# Patient Record
Sex: Female | Born: 1955 | Hispanic: No | Marital: Married | Smoking: Never smoker
Health system: Southern US, Community
[De-identification: ages and names within clinical notes are randomized; demographics above are authoritative.]

---

## 1999-03-16 ENCOUNTER — Other Ambulatory Visit: Admission: RE | Admit: 1999-03-16 | Discharge: 1999-03-16 | Payer: Self-pay | Admitting: *Deleted

## 2000-04-05 ENCOUNTER — Other Ambulatory Visit: Admission: RE | Admit: 2000-04-05 | Discharge: 2000-04-05 | Payer: Self-pay | Admitting: *Deleted

## 2001-04-11 ENCOUNTER — Other Ambulatory Visit: Admission: RE | Admit: 2001-04-11 | Discharge: 2001-04-11 | Payer: Self-pay | Admitting: *Deleted

## 2002-04-21 ENCOUNTER — Other Ambulatory Visit: Admission: RE | Admit: 2002-04-21 | Discharge: 2002-04-21 | Payer: Self-pay | Admitting: *Deleted

## 2003-04-27 ENCOUNTER — Other Ambulatory Visit: Admission: RE | Admit: 2003-04-27 | Discharge: 2003-04-27 | Payer: Self-pay | Admitting: *Deleted

## 2006-07-05 ENCOUNTER — Ambulatory Visit: Payer: Self-pay | Admitting: Gastroenterology

## 2010-07-19 ENCOUNTER — Encounter: Admission: RE | Admit: 2010-07-19 | Discharge: 2010-07-19 | Payer: Self-pay | Admitting: Family Medicine

## 2011-08-10 ENCOUNTER — Encounter: Payer: Self-pay | Admitting: Genetic Counselor

## 2011-09-11 ENCOUNTER — Telehealth: Payer: Self-pay | Admitting: Genetic Counselor

## 2013-08-12 ENCOUNTER — Encounter: Payer: Self-pay | Admitting: *Deleted

## 2013-08-12 NOTE — Progress Notes (Signed)
Per Clydie Braun - put an 11 lab appt on pts schedule.

## 2013-11-13 ENCOUNTER — Other Ambulatory Visit: Payer: BC Managed Care – PPO

## 2013-11-13 ENCOUNTER — Ambulatory Visit (HOSPITAL_BASED_OUTPATIENT_CLINIC_OR_DEPARTMENT_OTHER): Payer: BC Managed Care – PPO | Admitting: Genetic Counselor

## 2013-11-13 ENCOUNTER — Encounter: Payer: Self-pay | Admitting: Genetic Counselor

## 2013-11-13 ENCOUNTER — Encounter (INDEPENDENT_AMBULATORY_CARE_PROVIDER_SITE_OTHER): Payer: Self-pay

## 2013-11-13 DIAGNOSIS — Z8041 Family history of malignant neoplasm of ovary: Secondary | ICD-10-CM

## 2013-11-13 DIAGNOSIS — IMO0002 Reserved for concepts with insufficient information to code with codable children: Secondary | ICD-10-CM

## 2013-11-13 DIAGNOSIS — Z8042 Family history of malignant neoplasm of prostate: Secondary | ICD-10-CM

## 2013-11-13 NOTE — Progress Notes (Signed)
Dr.  Larina Earthly requested a consultation for genetic counseling and risk assessment for Bailey Holmes, a 58 y.o. female, for discussion of her family history of ovarian, prostate, melanoma, and possibly colon cancer.  She presents to clinic today to discuss the possibility of a genetic predisposition to cancer, and to further clarify her risks, as well as her family members' risks for cancer.   HISTORY OF PRESENT ILLNESS: Bailey Holmes is a 58 y.o. female with no personal history of cancer.  She had a colonoscopy in 2006 that was reportedly negative for polyps.  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  In 2012 she was tested through [facility] for BRCA mutations, but did not have BART.  That testing was negative.  History reviewed. No pertinent past medical history.  History reviewed. No pertinent past surgical history.  History   Social History  . Marital Status: Unknown    Spouse Name: N/A    Number of Children: 0  . Years of Education: N/A   Social History Main Topics  . Smoking status: Never Smoker   . Smokeless tobacco: None  . Alcohol Use: No  . Drug Use: No  . Sexual Activity: None   Other Topics Concern  . None   Social History Narrative  . None    REPRODUCTIVE HISTORY AND PERSONAL RISK ASSESSMENT FACTORS: Menarche was at age 38.   postmenopausal Uterus Intact: yes Ovaries Intact: no, removed at age 34 due to mother's diagnosis. [id number], first live birth at age N/A  She has not previously undergone treatment for infertility.   Oral Contraceptive use: 18 years   She has used HRT in the past.    FAMILY HISTORY:  We obtained a detailed, 4-generation family history.  Significant diagnoses are listed below: Family History  Problem Relation Age of Onset  . Ovarian cancer Mother 59  . Prostate cancer Father     dx in his early 20s, came back at 25  . Melanoma Father 10  . Lymphoma Father 67  . Hodgkin's lymphoma  Father     dx in his 55s  . Cancer Maternal Aunt     unsure if colon or ovarian cancer  . Cancer Paternal Uncle     cancer NOS  . COPD Maternal Grandmother   . Liver cancer Maternal Grandfather     unsure if this was the primary or a met.  . Prostate cancer Paternal Grandfather   . Lung cancer Maternal Aunt     smoker    Patient's ancestors are of [place] descent. There is no reported Ashkenazi Jewish ancestry. There is no known consanguinity.  GENETIC COUNSELING ASSESSMENT: Bailey Holmes is a 58 y.o. female with a family history of ovarian, prostate, melanoma, hodgkins lymphoma and possibly colon cancer which somewhat suggestive of a hereditary cancer syndrome and predisposition to cancer. We, therefore, discussed and recommended the following at today's visit.   DISCUSSION: We reviewed the characteristics, features and inheritance patterns of hereditary cancer syndromes. We also discussed genetic testing, including the appropriate family members to test, the process of testing, insurance coverage and turn-around-time for results. We discussed that her negative BRCA test reduced the likelihood of her having a BRCA mutation, although many families are missed by not having the del/dup testing.  Within the last year, [name] learned that her maternal aunt had either colon or ovarian cancer.  We discussed Lynch syndrome testing based on a possible  colon cancer diagnosis in her maternal aunt, and the ovarian cancer in her mother.  [name] was not tested for Lynch syndrome in the past. Lastly, we reviewed that there are several other hereditary cancer genes that are associated with ovarian cancer.  We will recommend the breast/ovarian cancer panel for Ms. Campo.  PLAN: After considering the risks, benefits, and limitations, [name] provided informed consent to pursue genetic testing and the blood sample will be sent to [facility] for analysis of the Breast/Ovarian cancer panel. We  discussed the implications of a positive, negative and/ or variant of uncertain significance genetic test result. Results should be available within approximately 3 weeks' time, at which point they will be disclosed by telephone to Bailey Holmes, as will any additional recommendations warranted by these results. Bailey Holmes will receive a summary of her genetic counseling visit and a copy of her results once available. This information will also be available in Epic. We encouraged Bailey Holmes to remain in contact with cancer genetics annually so that we can continuously update the family history and inform her of any changes in cancer genetics and testing that may be of benefit for her family. Bailey Holmes's questions were answered to her satisfaction today. Our contact information was provided should additional questions or concerns arise.  The patient was seen for a total of 45 minutes, greater than 50% of which was spent face-to-face counseling.  This note will also be sent to the referring provider via the electronic medical record. The patient will be supplied with a summary of this genetic counseling discussion as well as educational information on the discussed hereditary cancer syndromes following the conclusion of their visit.   Patient was discussed with Dr. Marcy Panning.   _______________________________________________________________________ For Office Staff:  Number of people involved in session: 2 Was an Intern/ student involved with case: no

## 2013-11-25 ENCOUNTER — Encounter: Payer: Self-pay | Admitting: Genetic Counselor

## 2013-11-25 ENCOUNTER — Telehealth: Payer: Self-pay | Admitting: Genetic Counselor

## 2013-11-25 NOTE — Telephone Encounter (Signed)
Left good news message on machine. 

## 2013-11-25 NOTE — Telephone Encounter (Signed)
REvealed negative genetic testing.  Discussed taht we do not know why her mother had ovarian cancer at such a young age, but that it does not appear that she had a genetic change in any of the genes that we looked at, that was then passed to Bailey Holmes.  Discussed to keep in touch with us every 3-5 years to see if there is updated testing taht needs to be performed.

## 2014-02-06 NOTE — Telephone Encounter (Signed)
Please see Visit Info comments 

## 2014-10-28 ENCOUNTER — Other Ambulatory Visit: Payer: Self-pay | Admitting: Family Medicine

## 2014-10-28 DIAGNOSIS — R1084 Generalized abdominal pain: Secondary | ICD-10-CM

## 2014-11-04 ENCOUNTER — Other Ambulatory Visit: Payer: Self-pay

## 2014-11-05 ENCOUNTER — Ambulatory Visit
Admission: RE | Admit: 2014-11-05 | Discharge: 2014-11-05 | Disposition: A | Discharge status: Home / Self Care | Payer: BC Managed Care – PPO | Source: Ambulatory Visit | Attending: Family Medicine | Admitting: Family Medicine

## 2014-11-05 DIAGNOSIS — R1084 Generalized abdominal pain: Secondary | ICD-10-CM

## 2015-02-22 ENCOUNTER — Telehealth: Payer: Self-pay | Admitting: Genetic Counselor

## 2015-02-22 NOTE — Telephone Encounter (Signed)
LM on VM stating the information that I had received from billing at [facility].  Called [facility], they confirmed that her OOP is $100. They are still appealing with the insurance company, and they can appeal up to 3 times. What she is receiving, is also being seen by the billing department. Regardless of whether [employer] pays all, a portion, or none of the bill, her OOP will not change. Most likely, [employer] will send her a check for the portion they will pay, and she will then need to endorse the check and send to [facility].

## 2015-02-22 NOTE — Telephone Encounter (Signed)
Bailey Holmes called and left an irate VM that she has received a letter from her insurance company stating that they will not cover the test.  Called [facility], they confirmed that her OOP is $100.  They are still appealing with the insurance company, and they can appeal up to 3 times.  What she is receiving, is also being seen by the billing department.  Regardless of whether [employer] pays all, a portion, or none of the bill, her OOP will not change.  Most likely, [employer] will send her a check for the portion they will pay, and she will then need to endorse the check and send to [facility].

## 2016-01-16 IMAGING — US US ABDOMEN COMPLETE
1 series · 14 of 25 positions shown · non-contrast
Comparison: None.

CLINICAL DATA: Abdominal pain.

EXAM:
ULTRASOUND ABDOMEN COMPLETE

[Series 1: us abdomen complete · 0.24mm/px · 14 of 77 slices shown]
[im 1/77]
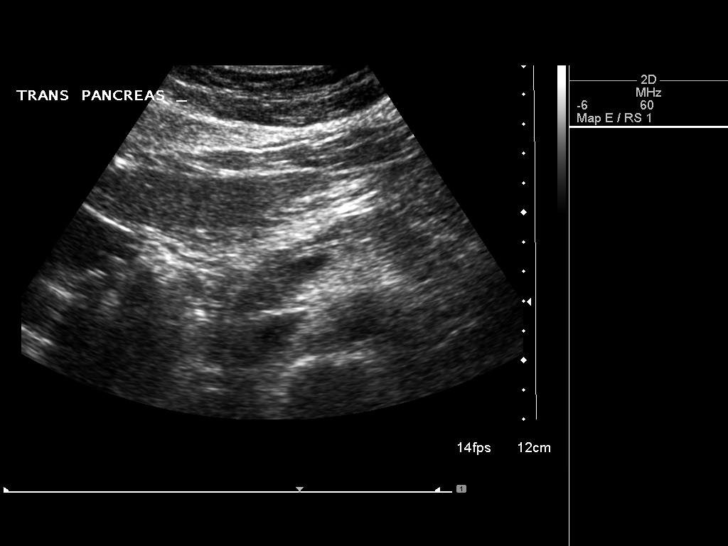
[im 7/77]
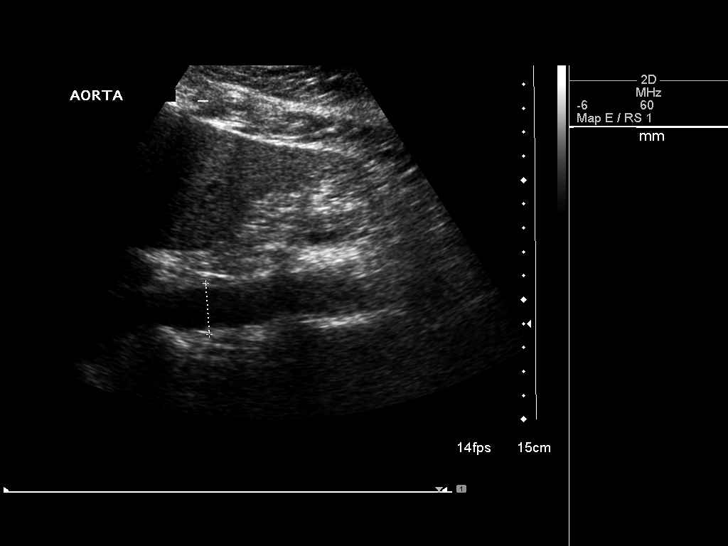
[im 13/77]
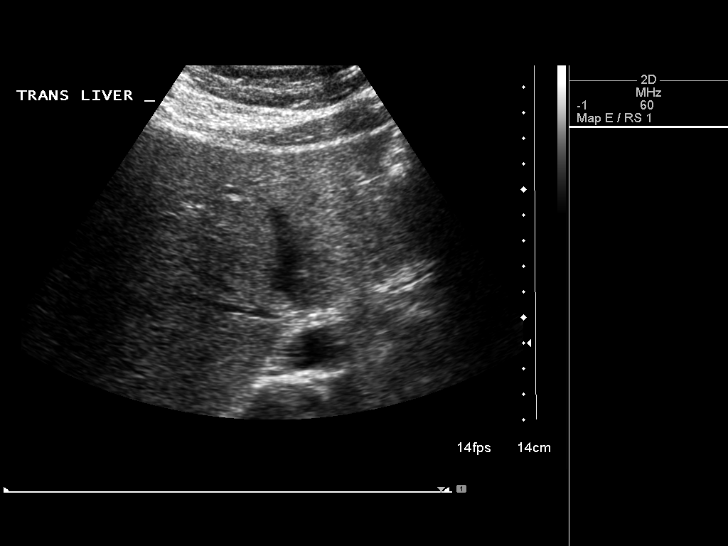
[im 20/77]
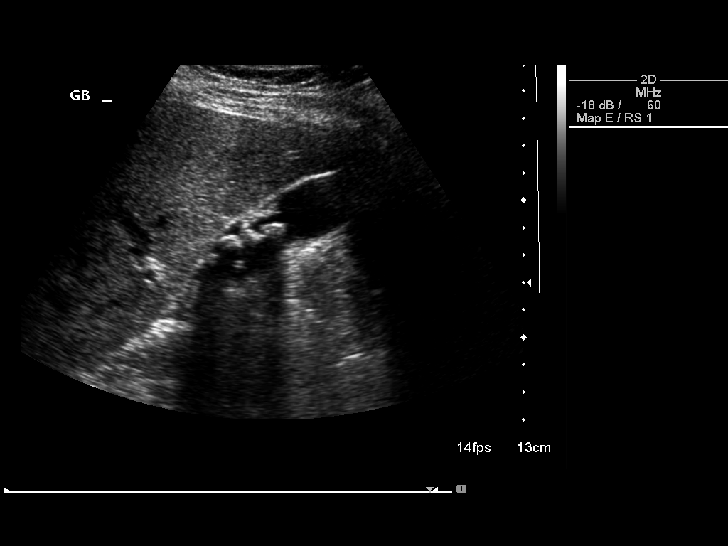
[im 26/77]
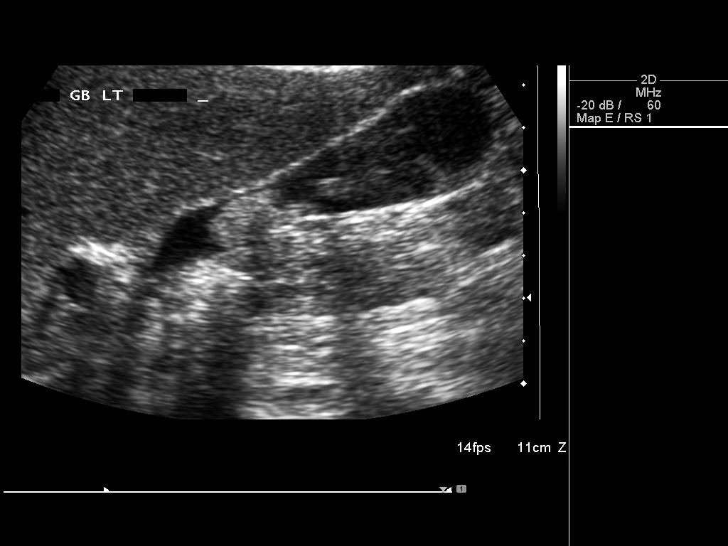
[im 29/77]
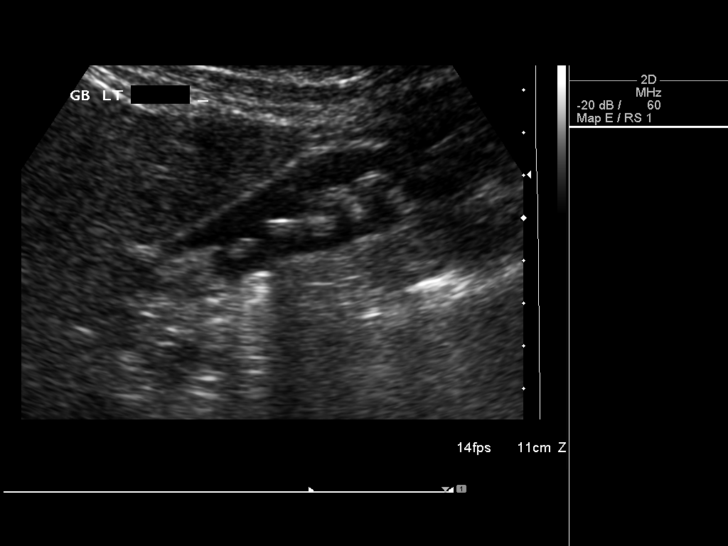
[im 35/77]
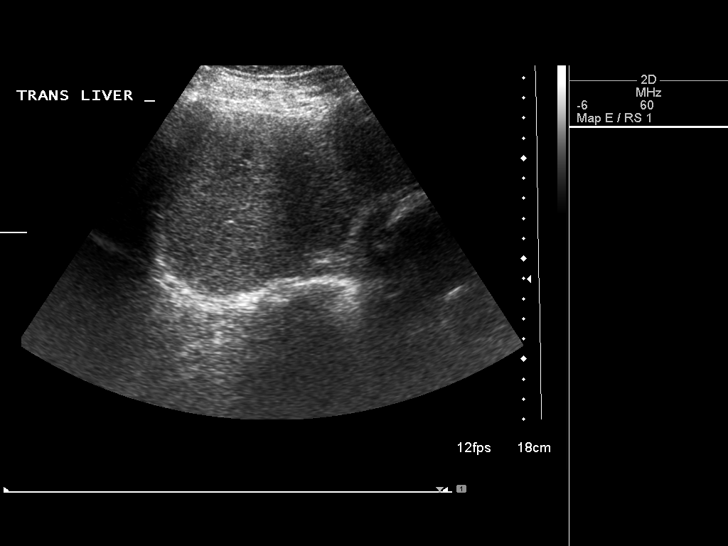
[im 42/77]
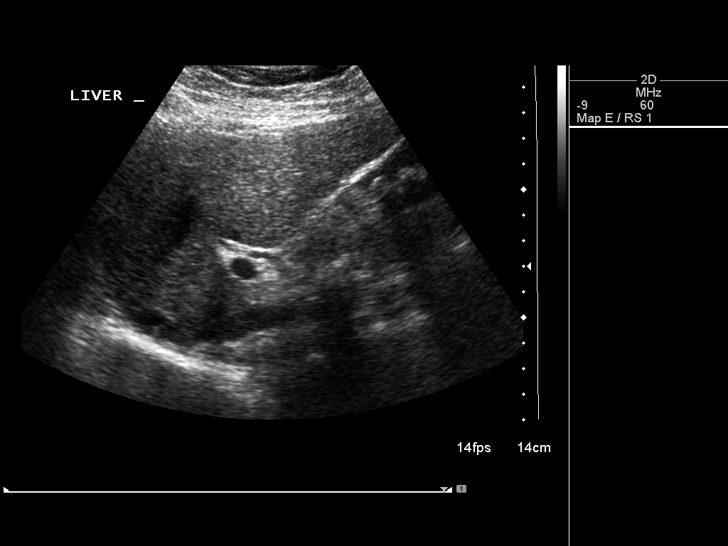
[im 48/77]
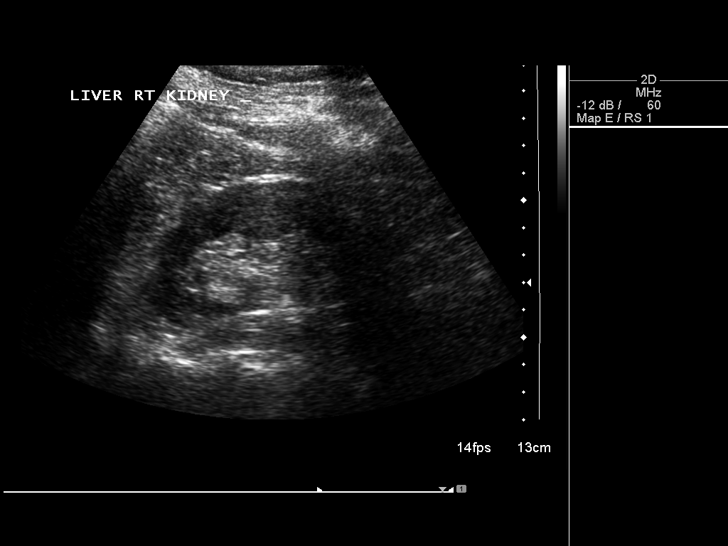
[im 51/77]
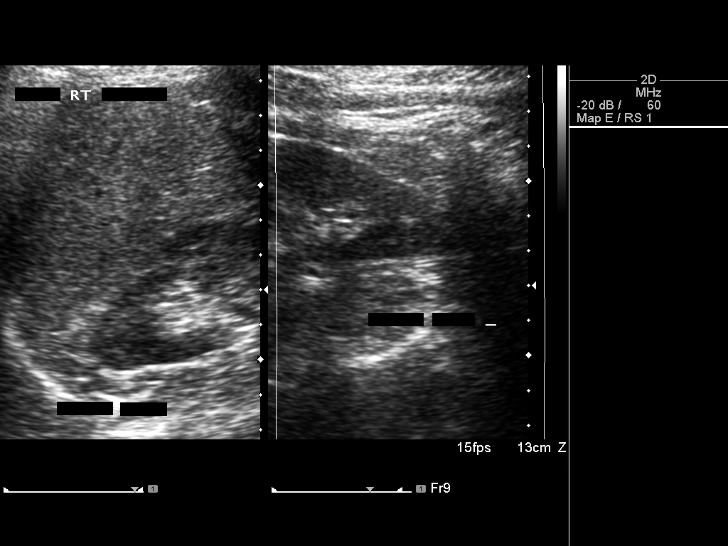
[im 58/77]
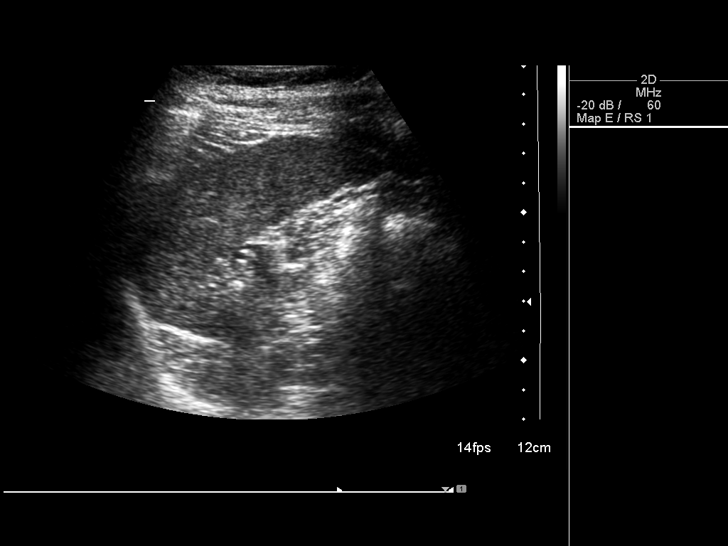
[im 64/77]
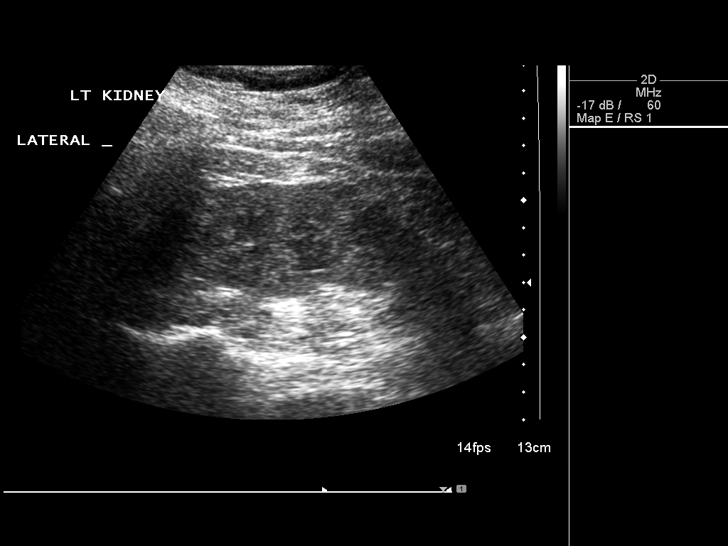
[im 70/77]
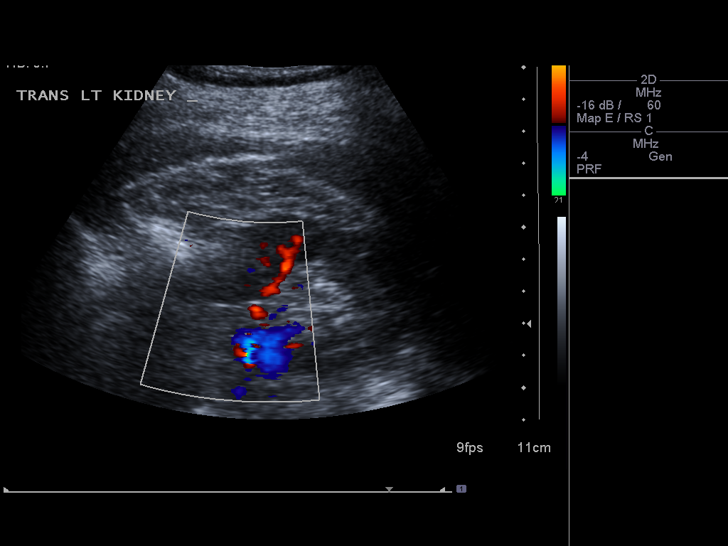
[im 77/77]
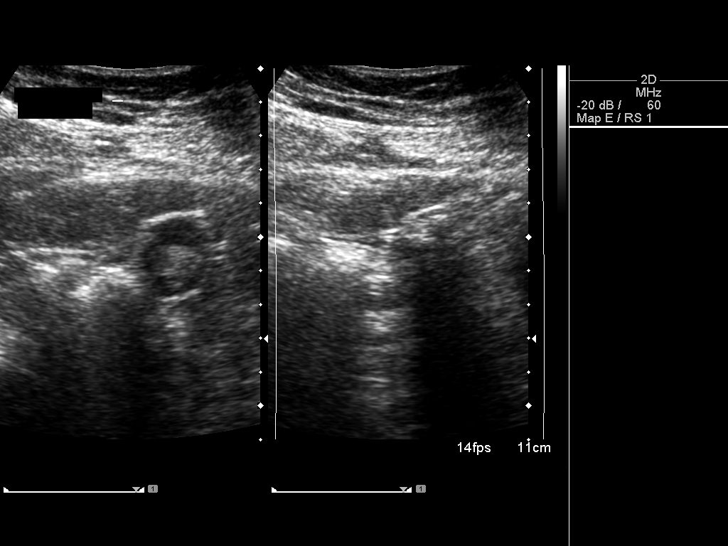

[14 of 25 positions shown; findings below may reference images not displayed]

FINDINGS: Gallbladder: Stones and sludge noted. Gallbladder wall thickness
mm. Negative Murphy sign.

Common bile duct: Diameter: 3.1 mm

Liver: No focal lesion identified. Within normal limits in
parenchymal echogenicity.

IVC: No abnormality visualized.

Pancreas: Visualized portion unremarkable.

Spleen: Size and appearance within normal limits.

Right Kidney: Length: 11.7 cm. Echogenicity within normal limits. No
mass or hydronephrosis visualized.

Left Kidney: Length: 12.4 cm. Echogenicity within normal limits. No
mass or hydronephrosis visualized.

Abdominal aorta: No aneurysm visualized.

Other findings: None.
IMPRESSION: Gallstones and sludge. Gallbladder wall thickness normal. Negative
Murphy's sign. No biliary distention.

## 2017-05-14 ENCOUNTER — Other Ambulatory Visit: Payer: Self-pay | Admitting: Family Medicine

## 2017-05-14 ENCOUNTER — Ambulatory Visit
Admission: RE | Admit: 2017-05-14 | Discharge: 2017-05-14 | Disposition: A | Discharge status: Home / Self Care | Payer: BC Managed Care – PPO | Source: Ambulatory Visit | Attending: Family Medicine | Admitting: Family Medicine

## 2017-05-14 DIAGNOSIS — M25551 Pain in right hip: Secondary | ICD-10-CM | POA: Diagnosis present

## 2017-05-15 ENCOUNTER — Ambulatory Visit: Payer: BC Managed Care – PPO

## 2018-10-10 ENCOUNTER — Ambulatory Visit
Admission: RE | Admit: 2018-10-10 | Discharge: 2018-10-10 | Disposition: A | Discharge status: Home / Self Care | Payer: BC Managed Care – PPO | Source: Ambulatory Visit | Attending: Family Medicine | Admitting: Family Medicine

## 2018-10-10 ENCOUNTER — Other Ambulatory Visit: Payer: Self-pay | Admitting: Family Medicine

## 2018-10-10 DIAGNOSIS — M2540 Effusion, unspecified joint: Secondary | ICD-10-CM

## 2018-11-06 ENCOUNTER — Encounter: Payer: Self-pay | Admitting: Genetic Counselor

## 2019-12-21 IMAGING — CR DG HAND 2V*L*
2 series · 2 of 2 positions shown · non-contrast
Comparison: 04/04/2018

CLINICAL DATA: Hand swelling, joint pain

EXAM:
LEFT HAND - 2 VIEW

[x hand pa left]
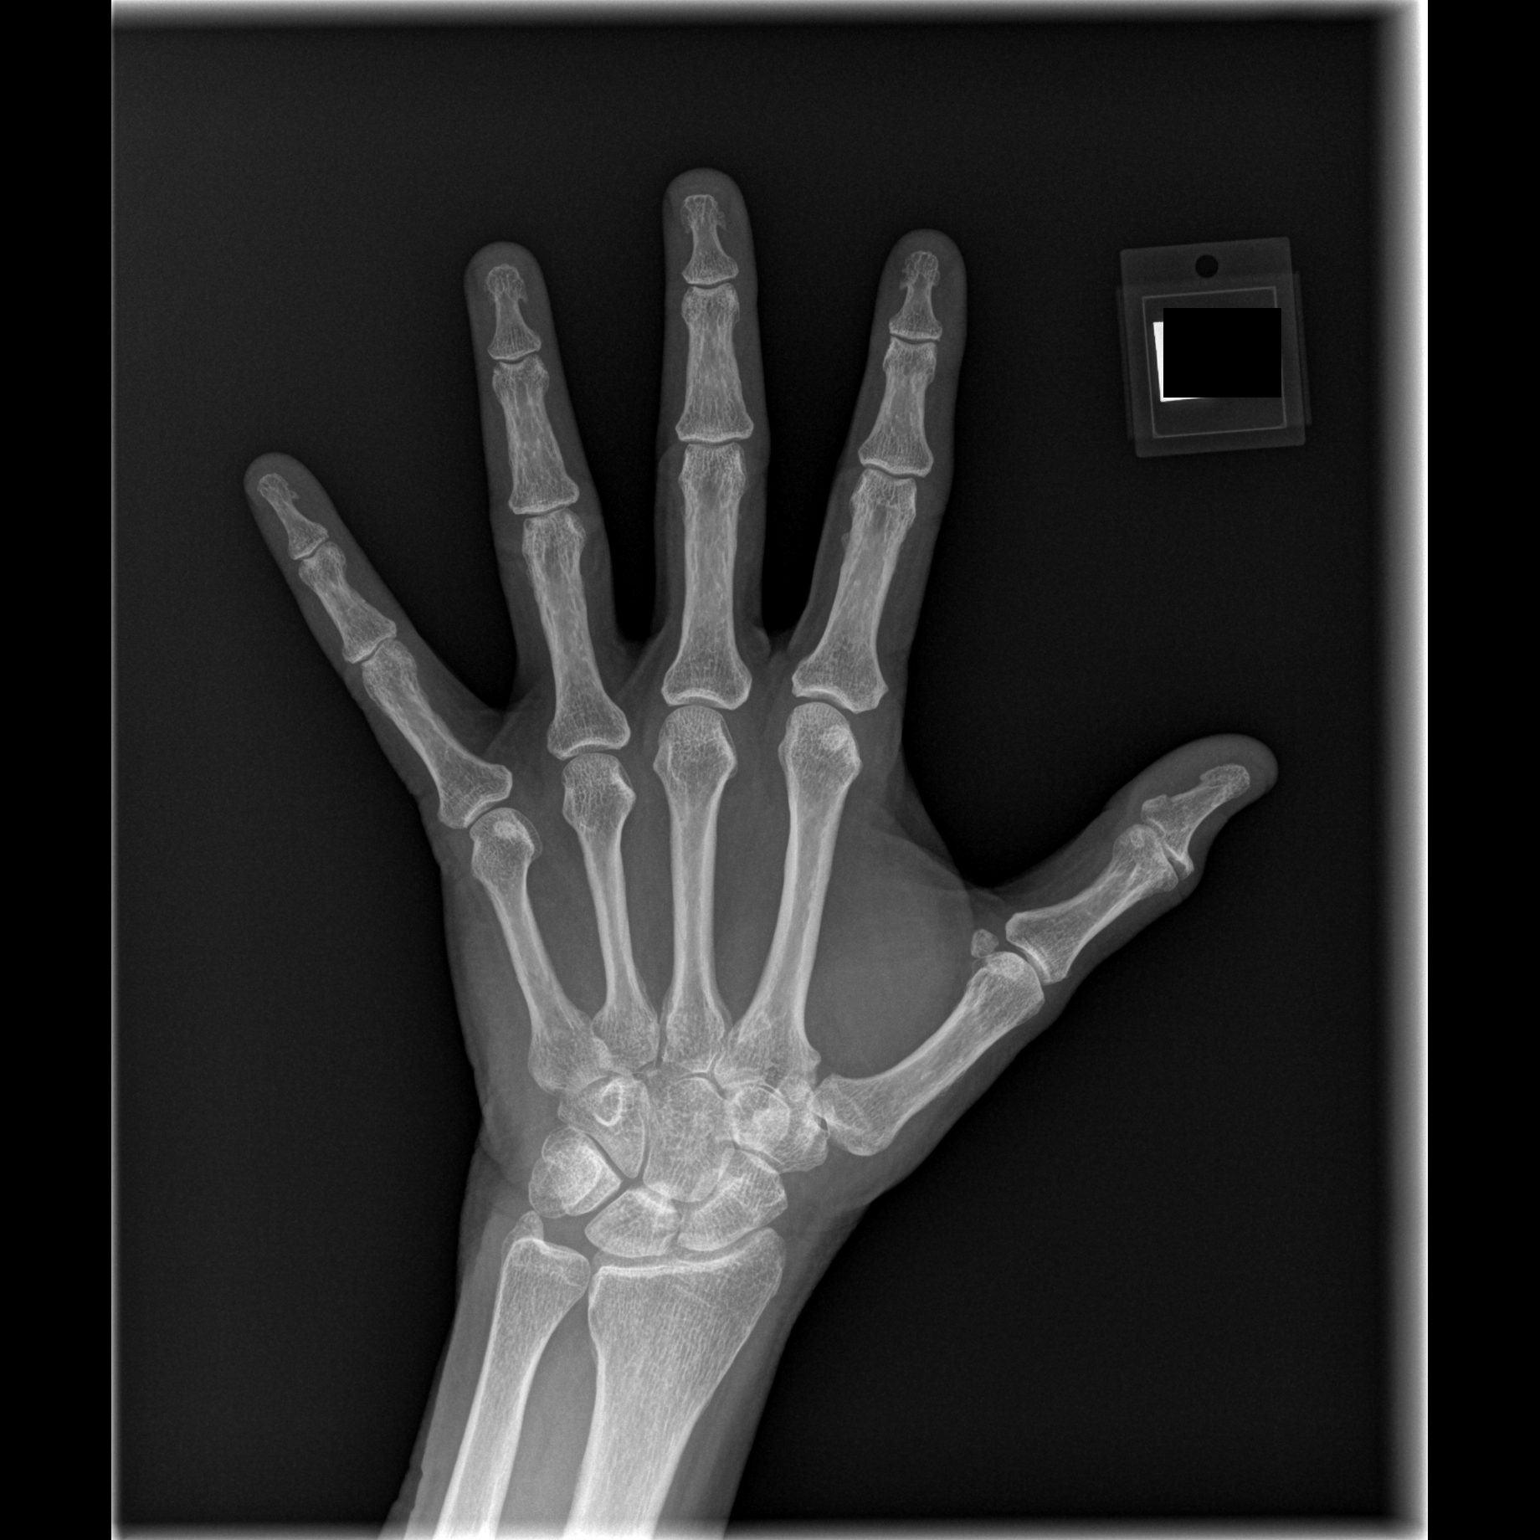

[x hand lat left]
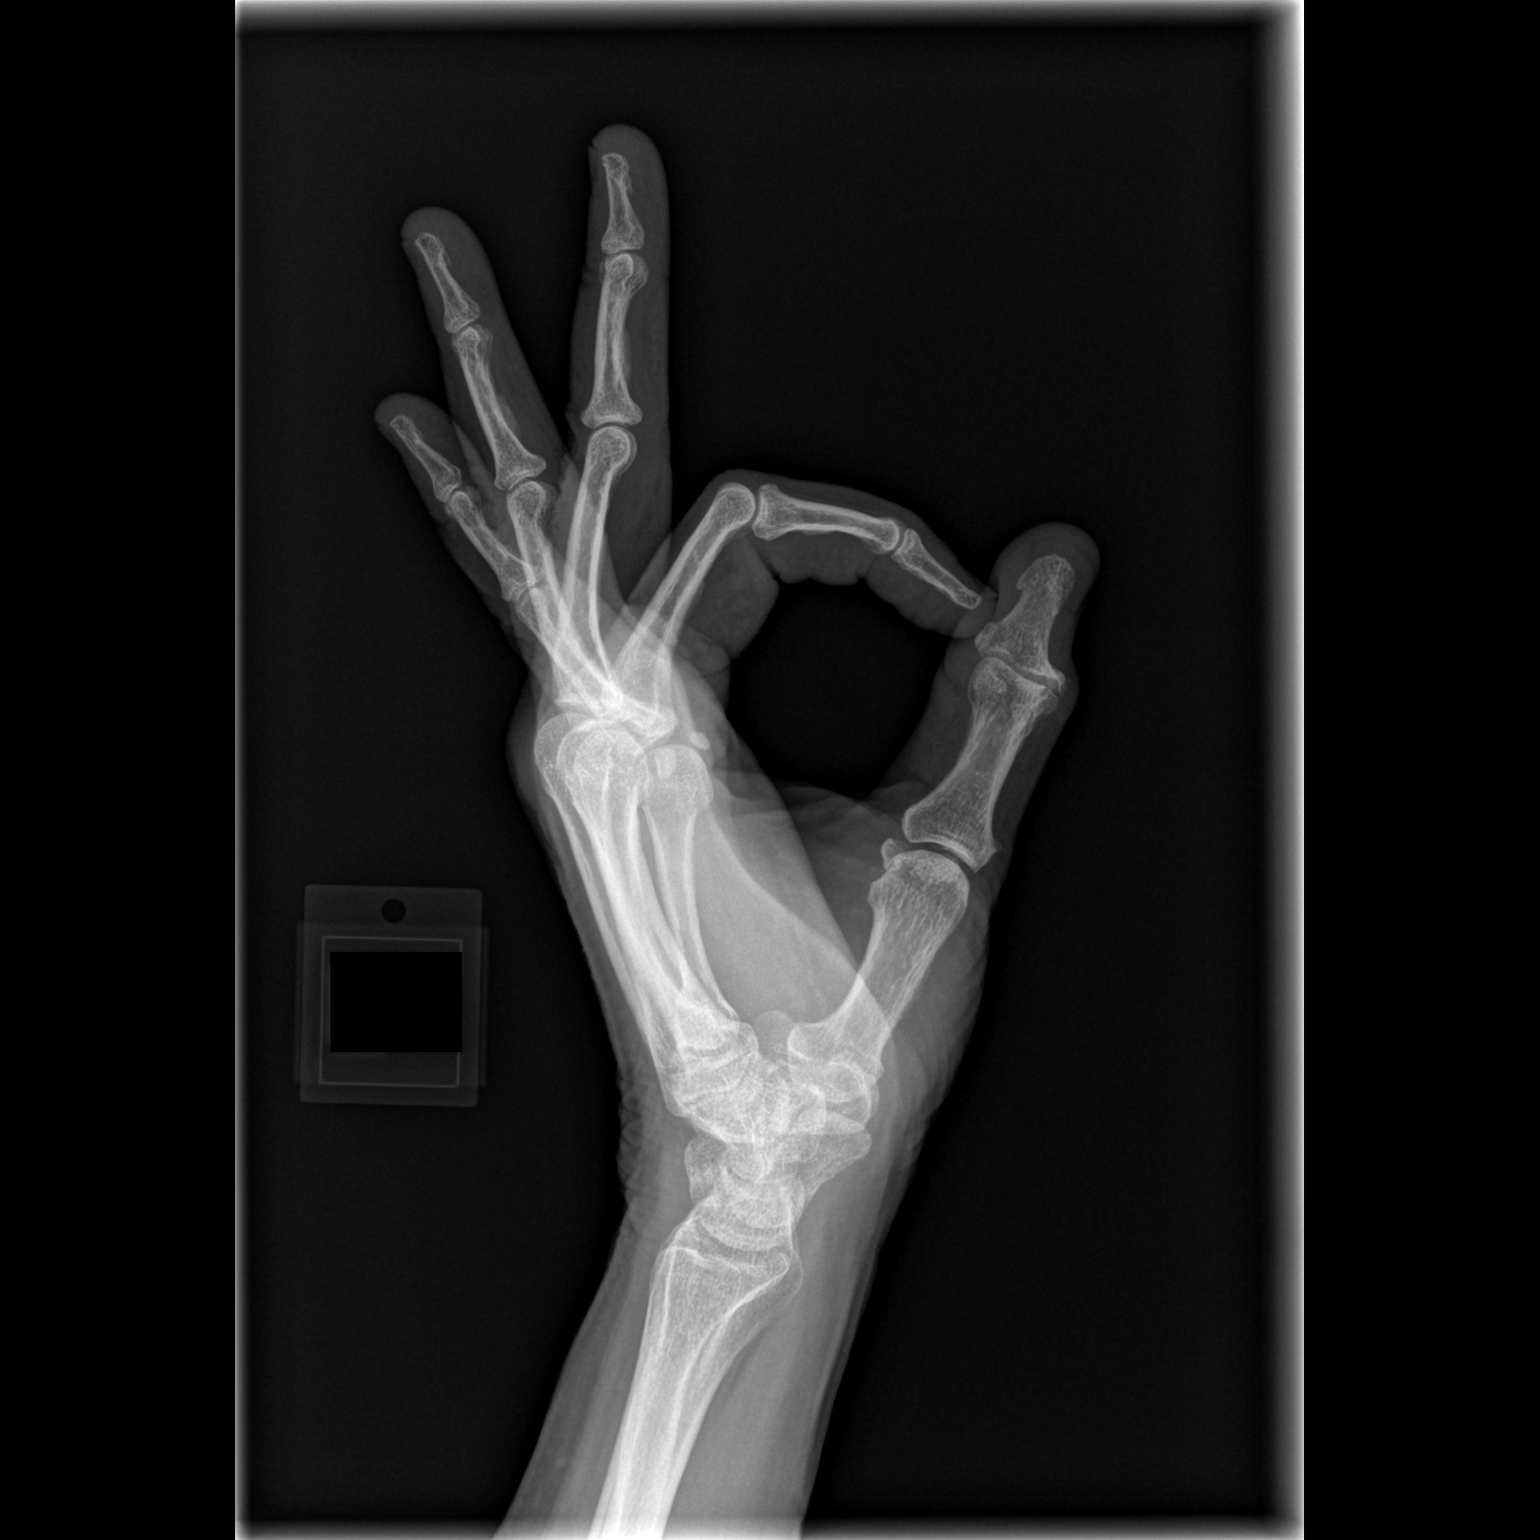

[2 of 2 positions shown; findings below may reference images not displayed]

FINDINGS: Normal alignment without acute osseous finding. Negative for
fracture. No soft tissue abnormality. Minor joint space loss of the
DIP and PIP joints diffusely as well as the thumb interphalangeal
joint compatible with osteoarthritis. No significant erosive
process. No soft tissue abnormality or abnormal calcification.
IMPRESSION: Minor joint space loss of the DIP and PIP joints as well as the
thumb interphalangeal joint compatible with osteoarthritis.

No acute finding.

## 2019-12-21 IMAGING — CR DG HAND 2V*R*
2 series · 2 of 2 positions shown · non-contrast
Comparison: None

CLINICAL DATA: BILATERAL hand pain and swelling of joints for 6
months

EXAM:
RIGHT HAND - 2 VIEW

[x hand pa right]
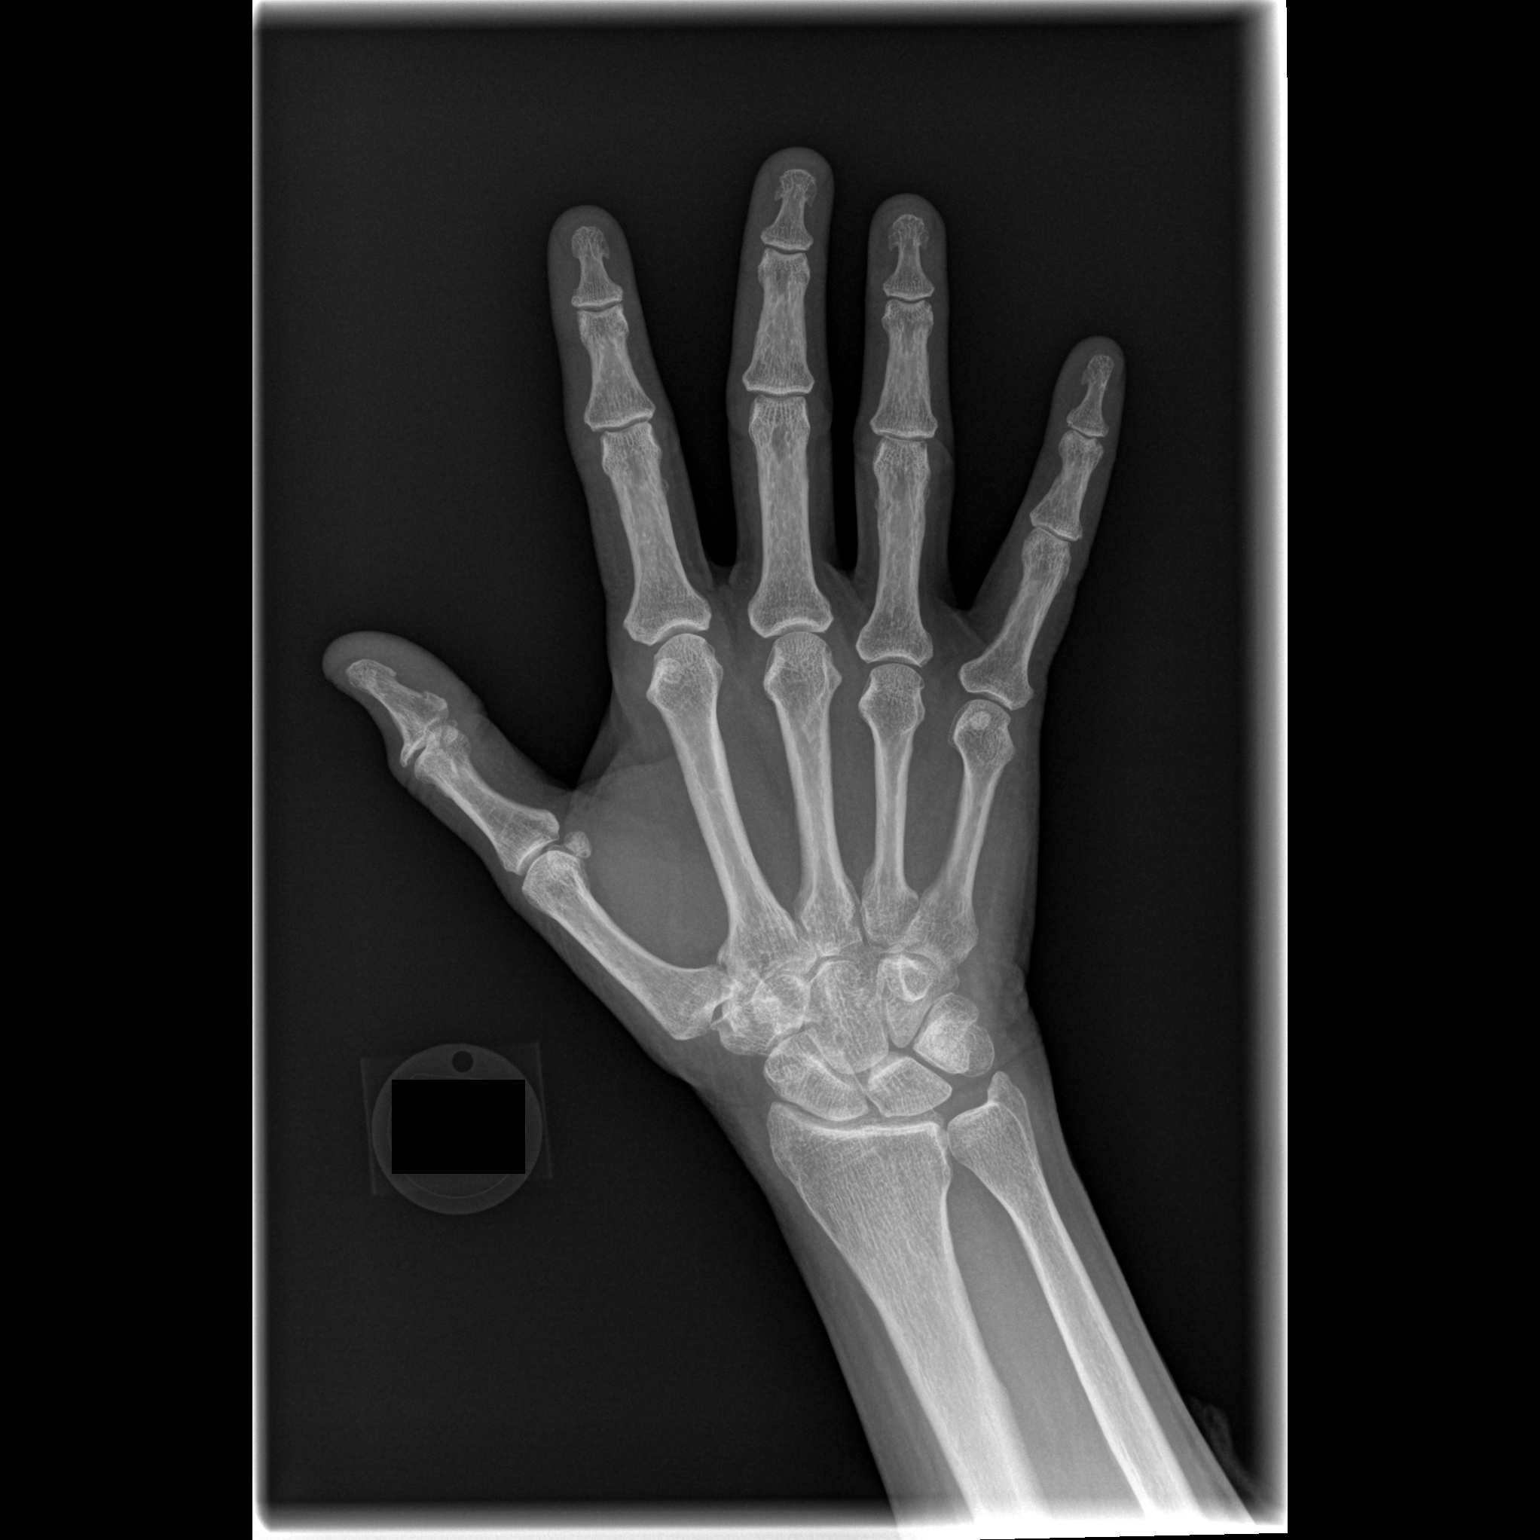

[x hand lat right]
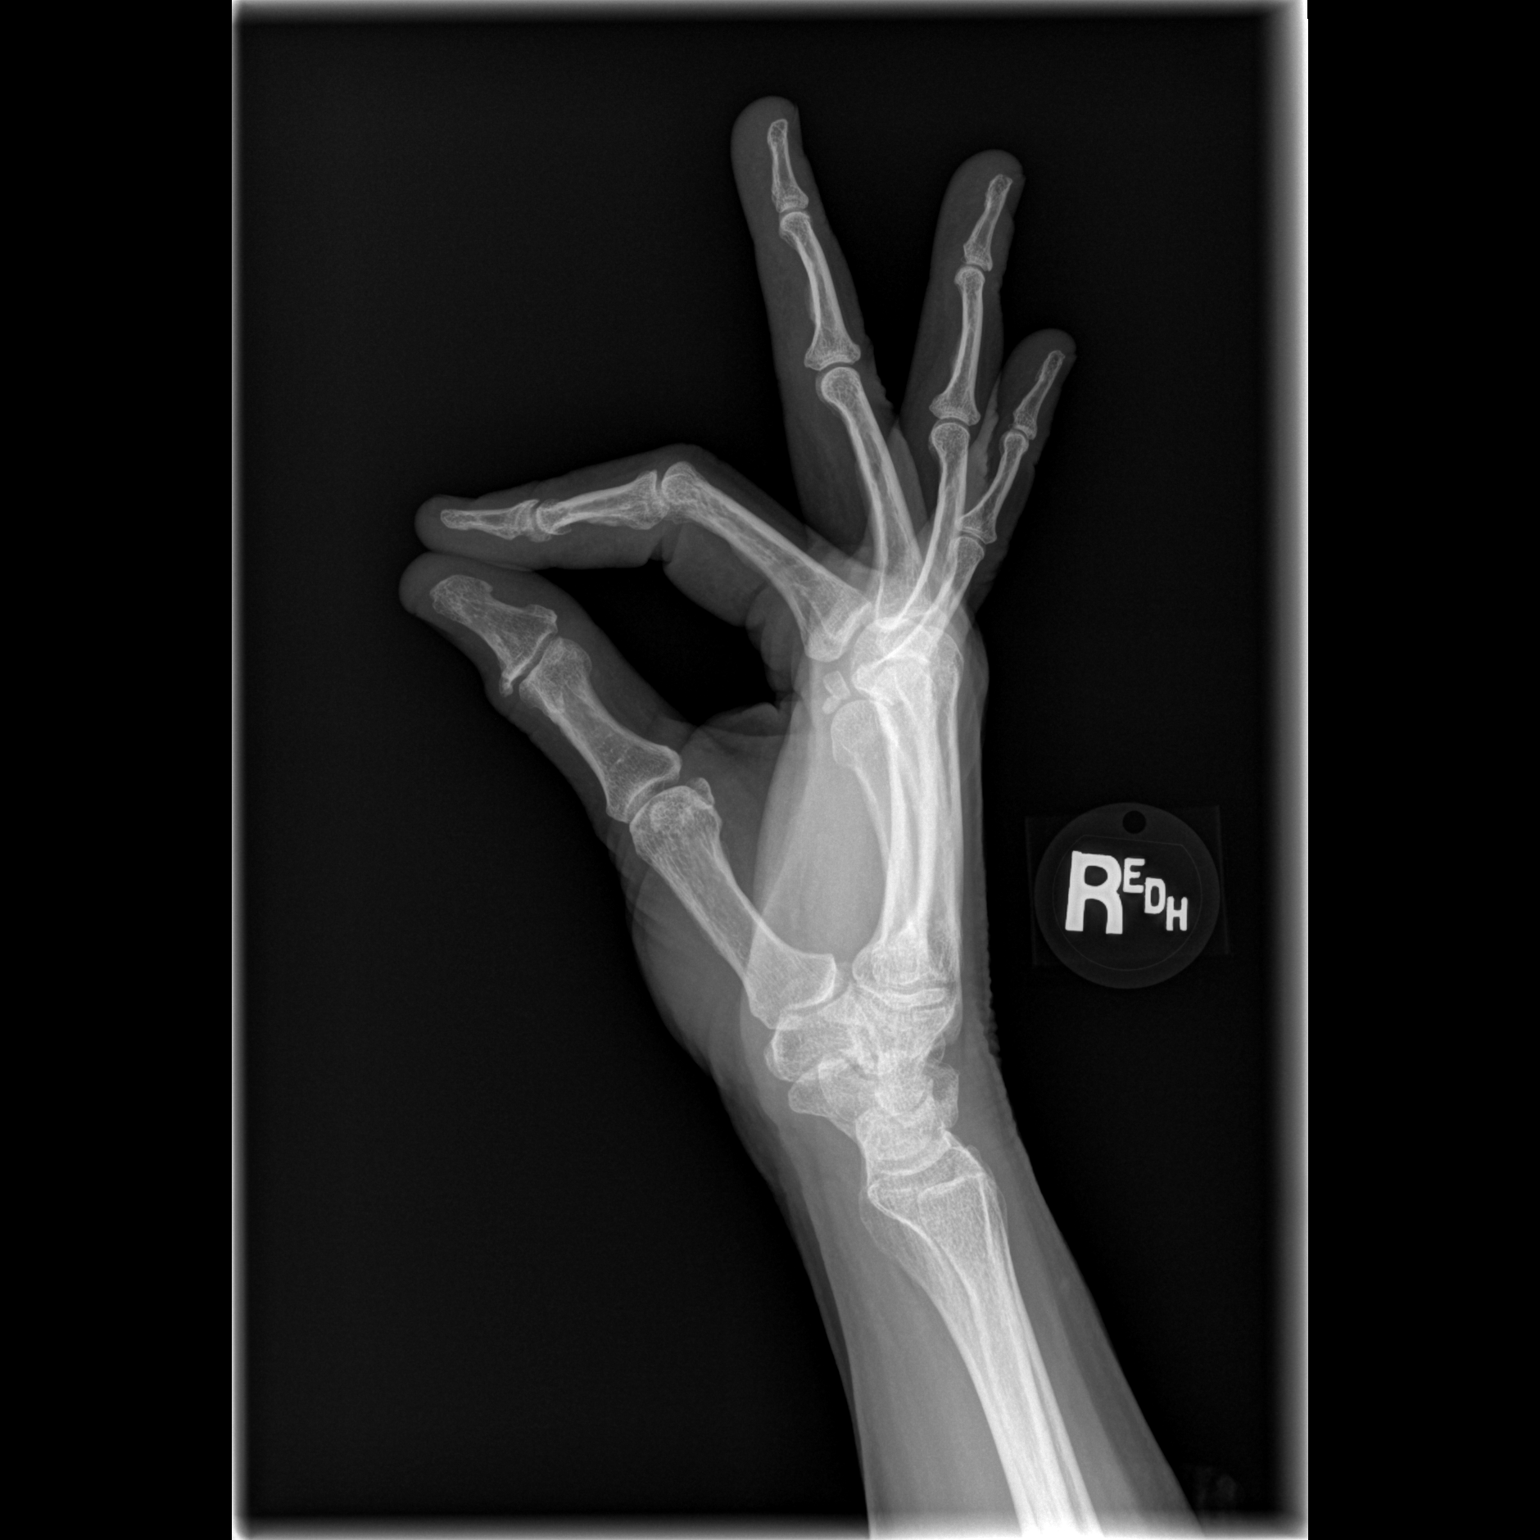

[2 of 2 positions shown; findings below may reference images not displayed]

FINDINGS: Osseous mineralization diffusely.

Joint spaces preserved.

No acute fracture, dislocation, or bone destruction.

No erosive or inflammatory changes.
IMPRESSION: No acute abnormalities.

## 2023-01-18 DIAGNOSIS — E538 Deficiency of other specified B group vitamins: Secondary | ICD-10-CM | POA: Diagnosis not present

## 2023-01-18 DIAGNOSIS — E559 Vitamin D deficiency, unspecified: Secondary | ICD-10-CM | POA: Diagnosis not present

## 2023-01-18 DIAGNOSIS — Z0000 Encounter for general adult medical examination without abnormal findings: Secondary | ICD-10-CM | POA: Diagnosis not present

## 2023-01-18 DIAGNOSIS — E2839 Other primary ovarian failure: Secondary | ICD-10-CM | POA: Diagnosis not present

## 2023-01-18 DIAGNOSIS — F4321 Adjustment disorder with depressed mood: Secondary | ICD-10-CM | POA: Diagnosis not present

## 2023-04-16 DIAGNOSIS — D225 Melanocytic nevi of trunk: Secondary | ICD-10-CM | POA: Diagnosis not present

## 2023-04-16 DIAGNOSIS — L570 Actinic keratosis: Secondary | ICD-10-CM | POA: Diagnosis not present

## 2023-04-16 DIAGNOSIS — D2262 Melanocytic nevi of left upper limb, including shoulder: Secondary | ICD-10-CM | POA: Diagnosis not present

## 2023-04-16 DIAGNOSIS — L819 Disorder of pigmentation, unspecified: Secondary | ICD-10-CM | POA: Diagnosis not present

## 2023-04-16 DIAGNOSIS — L820 Inflamed seborrheic keratosis: Secondary | ICD-10-CM | POA: Diagnosis not present

## 2023-04-16 DIAGNOSIS — D485 Neoplasm of uncertain behavior of skin: Secondary | ICD-10-CM | POA: Diagnosis not present

## 2023-04-16 DIAGNOSIS — D2239 Melanocytic nevi of other parts of face: Secondary | ICD-10-CM | POA: Diagnosis not present

## 2023-04-16 DIAGNOSIS — L821 Other seborrheic keratosis: Secondary | ICD-10-CM | POA: Diagnosis not present

## 2023-04-16 DIAGNOSIS — Z85828 Personal history of other malignant neoplasm of skin: Secondary | ICD-10-CM | POA: Diagnosis not present

## 2023-05-14 DIAGNOSIS — Z03818 Encounter for observation for suspected exposure to other biological agents ruled out: Secondary | ICD-10-CM | POA: Diagnosis not present

## 2023-05-14 DIAGNOSIS — U071 COVID-19: Secondary | ICD-10-CM | POA: Diagnosis not present

## 2023-09-25 DIAGNOSIS — H5213 Myopia, bilateral: Secondary | ICD-10-CM | POA: Diagnosis not present

## 2023-09-25 DIAGNOSIS — H524 Presbyopia: Secondary | ICD-10-CM | POA: Diagnosis not present

## 2023-09-25 DIAGNOSIS — H52223 Regular astigmatism, bilateral: Secondary | ICD-10-CM | POA: Diagnosis not present

## 2023-09-28 DIAGNOSIS — F4321 Adjustment disorder with depressed mood: Secondary | ICD-10-CM | POA: Diagnosis not present

## 2023-09-28 DIAGNOSIS — F320 Major depressive disorder, single episode, mild: Secondary | ICD-10-CM | POA: Diagnosis not present

## 2023-11-02 DIAGNOSIS — Z1231 Encounter for screening mammogram for malignant neoplasm of breast: Secondary | ICD-10-CM | POA: Diagnosis not present

## 2023-11-29 DIAGNOSIS — R058 Other specified cough: Secondary | ICD-10-CM | POA: Diagnosis not present

## 2023-11-29 DIAGNOSIS — F320 Major depressive disorder, single episode, mild: Secondary | ICD-10-CM | POA: Diagnosis not present

## 2024-02-11 DIAGNOSIS — M65352 Trigger finger, left little finger: Secondary | ICD-10-CM | POA: Diagnosis not present

## 2024-02-15 DIAGNOSIS — Z0000 Encounter for general adult medical examination without abnormal findings: Secondary | ICD-10-CM | POA: Diagnosis not present

## 2024-02-15 DIAGNOSIS — E559 Vitamin D deficiency, unspecified: Secondary | ICD-10-CM | POA: Diagnosis not present

## 2024-02-15 DIAGNOSIS — M8588 Other specified disorders of bone density and structure, other site: Secondary | ICD-10-CM | POA: Diagnosis not present

## 2024-02-15 DIAGNOSIS — R252 Cramp and spasm: Secondary | ICD-10-CM | POA: Diagnosis not present

## 2024-02-15 DIAGNOSIS — J309 Allergic rhinitis, unspecified: Secondary | ICD-10-CM | POA: Diagnosis not present

## 2024-02-15 DIAGNOSIS — F320 Major depressive disorder, single episode, mild: Secondary | ICD-10-CM | POA: Diagnosis not present

## 2024-02-15 DIAGNOSIS — E538 Deficiency of other specified B group vitamins: Secondary | ICD-10-CM | POA: Diagnosis not present

## 2024-04-25 DIAGNOSIS — M8588 Other specified disorders of bone density and structure, other site: Secondary | ICD-10-CM | POA: Diagnosis not present

## 2024-04-25 DIAGNOSIS — N958 Other specified menopausal and perimenopausal disorders: Secondary | ICD-10-CM | POA: Diagnosis not present

## 2024-04-25 DIAGNOSIS — E2839 Other primary ovarian failure: Secondary | ICD-10-CM | POA: Diagnosis not present

## 2024-08-22 DIAGNOSIS — M25559 Pain in unspecified hip: Secondary | ICD-10-CM | POA: Diagnosis not present

## 2024-08-22 DIAGNOSIS — M8588 Other specified disorders of bone density and structure, other site: Secondary | ICD-10-CM | POA: Diagnosis not present

## 2024-08-22 DIAGNOSIS — F320 Major depressive disorder, single episode, mild: Secondary | ICD-10-CM | POA: Diagnosis not present

## 2024-08-22 DIAGNOSIS — M160 Bilateral primary osteoarthritis of hip: Secondary | ICD-10-CM | POA: Diagnosis not present
